# Patient Record
Sex: Female | Born: 2000 | Race: White | Hispanic: No | Marital: Single | Smoking: Never smoker
Health system: Southern US, Community
[De-identification: ages and names within clinical notes are randomized; demographics above are authoritative.]

---
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
## 2019-11-23 DIAGNOSIS — R200 Anesthesia of skin: Secondary | ICD-10-CM | POA: Diagnosis not present

## 2019-11-30 DIAGNOSIS — M542 Cervicalgia: Secondary | ICD-10-CM | POA: Diagnosis not present

## 2019-12-07 DIAGNOSIS — M542 Cervicalgia: Secondary | ICD-10-CM | POA: Diagnosis not present

## 2019-12-14 DIAGNOSIS — M542 Cervicalgia: Secondary | ICD-10-CM | POA: Diagnosis not present

## 2019-12-15 DIAGNOSIS — M47812 Spondylosis without myelopathy or radiculopathy, cervical region: Secondary | ICD-10-CM | POA: Diagnosis not present

## 2019-12-15 DIAGNOSIS — M542 Cervicalgia: Secondary | ICD-10-CM | POA: Diagnosis not present

## 2019-12-20 DIAGNOSIS — M542 Cervicalgia: Secondary | ICD-10-CM | POA: Diagnosis not present

## 2019-12-20 DIAGNOSIS — M47812 Spondylosis without myelopathy or radiculopathy, cervical region: Secondary | ICD-10-CM | POA: Diagnosis not present

## 2019-12-22 DIAGNOSIS — M542 Cervicalgia: Secondary | ICD-10-CM | POA: Diagnosis not present

## 2019-12-22 DIAGNOSIS — M47812 Spondylosis without myelopathy or radiculopathy, cervical region: Secondary | ICD-10-CM | POA: Diagnosis not present

## 2019-12-23 DIAGNOSIS — R200 Anesthesia of skin: Secondary | ICD-10-CM | POA: Diagnosis not present

## 2019-12-31 DIAGNOSIS — M542 Cervicalgia: Secondary | ICD-10-CM | POA: Diagnosis not present

## 2019-12-31 DIAGNOSIS — M47812 Spondylosis without myelopathy or radiculopathy, cervical region: Secondary | ICD-10-CM | POA: Diagnosis not present

## 2020-01-05 DIAGNOSIS — R200 Anesthesia of skin: Secondary | ICD-10-CM | POA: Diagnosis not present

## 2020-01-06 DIAGNOSIS — M542 Cervicalgia: Secondary | ICD-10-CM | POA: Diagnosis not present

## 2020-01-06 DIAGNOSIS — M47812 Spondylosis without myelopathy or radiculopathy, cervical region: Secondary | ICD-10-CM | POA: Diagnosis not present

## 2020-01-07 DIAGNOSIS — M542 Cervicalgia: Secondary | ICD-10-CM | POA: Diagnosis not present

## 2020-01-07 DIAGNOSIS — M549 Dorsalgia, unspecified: Secondary | ICD-10-CM | POA: Diagnosis not present

## 2020-01-10 DIAGNOSIS — M542 Cervicalgia: Secondary | ICD-10-CM | POA: Diagnosis not present

## 2020-01-10 DIAGNOSIS — M47812 Spondylosis without myelopathy or radiculopathy, cervical region: Secondary | ICD-10-CM | POA: Diagnosis not present

## 2020-01-11 DIAGNOSIS — S93402A Sprain of unspecified ligament of left ankle, initial encounter: Secondary | ICD-10-CM | POA: Diagnosis not present

## 2020-01-11 DIAGNOSIS — S93602A Unspecified sprain of left foot, initial encounter: Secondary | ICD-10-CM | POA: Diagnosis not present

## 2020-01-13 DIAGNOSIS — M549 Dorsalgia, unspecified: Secondary | ICD-10-CM | POA: Diagnosis not present

## 2020-01-13 DIAGNOSIS — Z0000 Encounter for general adult medical examination without abnormal findings: Secondary | ICD-10-CM | POA: Diagnosis not present

## 2020-01-13 DIAGNOSIS — M542 Cervicalgia: Secondary | ICD-10-CM | POA: Diagnosis not present

## 2020-01-13 DIAGNOSIS — Z13220 Encounter for screening for lipoid disorders: Secondary | ICD-10-CM | POA: Diagnosis not present

## 2020-01-13 DIAGNOSIS — Z7189 Other specified counseling: Secondary | ICD-10-CM | POA: Diagnosis not present

## 2020-01-17 DIAGNOSIS — M47812 Spondylosis without myelopathy or radiculopathy, cervical region: Secondary | ICD-10-CM | POA: Diagnosis not present

## 2020-01-17 DIAGNOSIS — M542 Cervicalgia: Secondary | ICD-10-CM | POA: Diagnosis not present

## 2020-01-18 DIAGNOSIS — R200 Anesthesia of skin: Secondary | ICD-10-CM | POA: Diagnosis not present

## 2020-01-18 DIAGNOSIS — M25531 Pain in right wrist: Secondary | ICD-10-CM | POA: Diagnosis not present

## 2020-01-25 DIAGNOSIS — S93402A Sprain of unspecified ligament of left ankle, initial encounter: Secondary | ICD-10-CM | POA: Diagnosis not present

## 2020-01-27 ENCOUNTER — Other Ambulatory Visit: Payer: Self-pay | Admitting: Family Medicine

## 2020-01-27 DIAGNOSIS — S93602S Unspecified sprain of left foot, sequela: Secondary | ICD-10-CM

## 2020-02-03 DIAGNOSIS — R200 Anesthesia of skin: Secondary | ICD-10-CM | POA: Diagnosis not present

## 2020-02-07 ENCOUNTER — Ambulatory Visit
Admission: RE | Admit: 2020-02-07 | Discharge: 2020-02-07 | Disposition: A | Discharge status: Home / Self Care | Payer: BC Managed Care – PPO | Source: Ambulatory Visit | Attending: Family Medicine | Admitting: Family Medicine

## 2020-02-07 DIAGNOSIS — R600 Localized edema: Secondary | ICD-10-CM | POA: Diagnosis not present

## 2020-02-07 DIAGNOSIS — S93602S Unspecified sprain of left foot, sequela: Secondary | ICD-10-CM

## 2020-02-07 DIAGNOSIS — M79672 Pain in left foot: Secondary | ICD-10-CM | POA: Diagnosis not present

## 2020-02-08 DIAGNOSIS — G5600 Carpal tunnel syndrome, unspecified upper limb: Secondary | ICD-10-CM | POA: Diagnosis not present

## 2020-02-08 DIAGNOSIS — M40209 Unspecified kyphosis, site unspecified: Secondary | ICD-10-CM | POA: Diagnosis not present

## 2020-02-16 DIAGNOSIS — S93602A Unspecified sprain of left foot, initial encounter: Secondary | ICD-10-CM | POA: Diagnosis not present

## 2020-03-13 DIAGNOSIS — R200 Anesthesia of skin: Secondary | ICD-10-CM | POA: Diagnosis not present

## 2020-03-13 DIAGNOSIS — R202 Paresthesia of skin: Secondary | ICD-10-CM | POA: Diagnosis not present

## 2020-03-13 DIAGNOSIS — R208 Other disturbances of skin sensation: Secondary | ICD-10-CM | POA: Diagnosis not present

## 2020-03-14 DIAGNOSIS — R339 Retention of urine, unspecified: Secondary | ICD-10-CM | POA: Diagnosis not present

## 2020-04-15 DIAGNOSIS — R200 Anesthesia of skin: Secondary | ICD-10-CM | POA: Diagnosis not present

## 2020-04-15 DIAGNOSIS — G35 Multiple sclerosis: Secondary | ICD-10-CM | POA: Diagnosis not present

## 2020-04-15 DIAGNOSIS — R202 Paresthesia of skin: Secondary | ICD-10-CM | POA: Diagnosis not present

## 2020-04-15 DIAGNOSIS — R208 Other disturbances of skin sensation: Secondary | ICD-10-CM | POA: Diagnosis not present

## 2020-05-02 DIAGNOSIS — Z01419 Encounter for gynecological examination (general) (routine) without abnormal findings: Secondary | ICD-10-CM | POA: Diagnosis not present

## 2020-05-02 DIAGNOSIS — N939 Abnormal uterine and vaginal bleeding, unspecified: Secondary | ICD-10-CM | POA: Diagnosis not present

## 2020-06-14 DIAGNOSIS — N939 Abnormal uterine and vaginal bleeding, unspecified: Secondary | ICD-10-CM | POA: Diagnosis not present

## 2020-06-14 DIAGNOSIS — R200 Anesthesia of skin: Secondary | ICD-10-CM | POA: Diagnosis not present

## 2020-06-14 DIAGNOSIS — R202 Paresthesia of skin: Secondary | ICD-10-CM | POA: Diagnosis not present

## 2020-06-14 DIAGNOSIS — R208 Other disturbances of skin sensation: Secondary | ICD-10-CM | POA: Diagnosis not present

## 2020-06-20 DIAGNOSIS — Z30013 Encounter for initial prescription of injectable contraceptive: Secondary | ICD-10-CM | POA: Diagnosis not present

## 2020-06-21 DIAGNOSIS — R350 Frequency of micturition: Secondary | ICD-10-CM | POA: Diagnosis not present

## 2020-06-21 DIAGNOSIS — N390 Urinary tract infection, site not specified: Secondary | ICD-10-CM | POA: Diagnosis not present

## 2020-07-04 DIAGNOSIS — Z3042 Encounter for surveillance of injectable contraceptive: Secondary | ICD-10-CM | POA: Diagnosis not present

## 2020-09-18 DIAGNOSIS — Z3042 Encounter for surveillance of injectable contraceptive: Secondary | ICD-10-CM | POA: Diagnosis not present

## 2020-11-13 DIAGNOSIS — R208 Other disturbances of skin sensation: Secondary | ICD-10-CM | POA: Diagnosis not present

## 2020-11-13 DIAGNOSIS — R202 Paresthesia of skin: Secondary | ICD-10-CM | POA: Diagnosis not present

## 2020-11-13 DIAGNOSIS — R200 Anesthesia of skin: Secondary | ICD-10-CM | POA: Diagnosis not present

## 2020-11-15 IMAGING — CT CT FOOT*L* W/O CM
3 of 5 series · 12 of 34 positions shown, 15 images · non-contrast
Comparison: None.

CLINICAL DATA: Fall from curved 1 month ago, dorsal foot pain. The
patient has been wearing a boot.

EXAM:
CT OF THE LEFT FOOT WITHOUT CONTRAST
TECHNIQUE: Multidetector CT imaging of the left foot was performed according to
the standard protocol. Multiplanar CT image reconstructions were
also generated.

[Series 5: sfov lower extremity 2.00 br40 s3 soft · axial · 0.51mm/px · z∈[+461,+559]mm · 5 of 76 slices shown, 7 images (1 of 2)]
[im 13/76  soft-tissue]
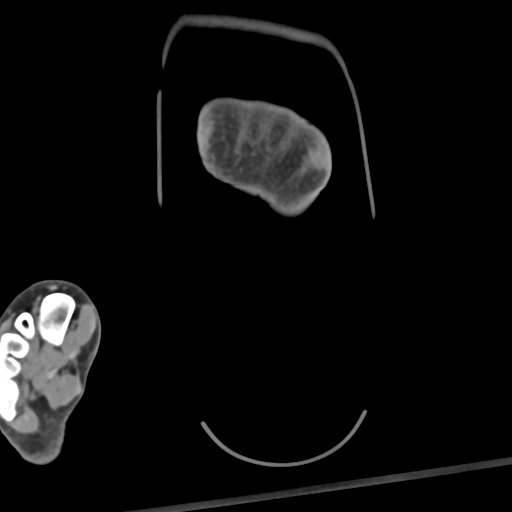
[im 13/76  bone]
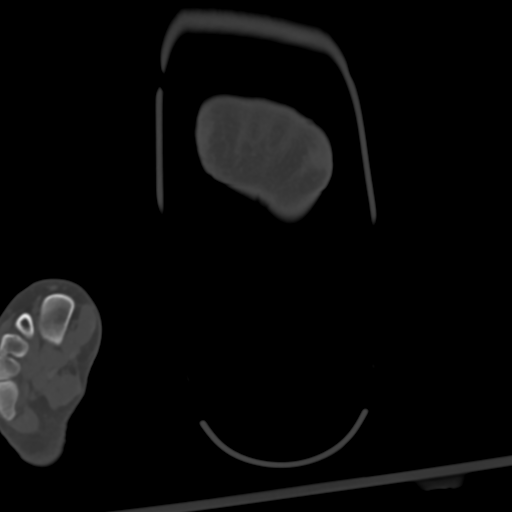
[im 26/76  bone]
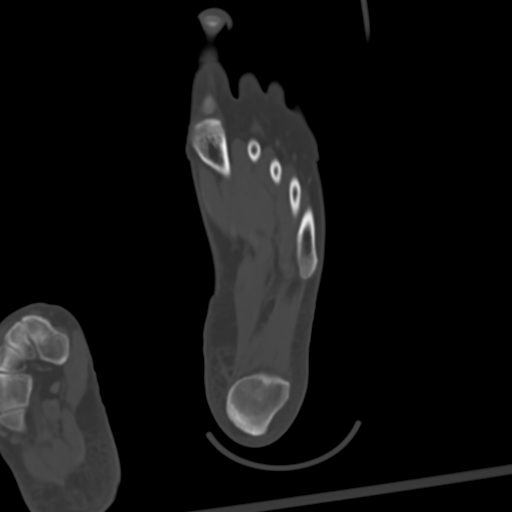
[im 38/76  bone]
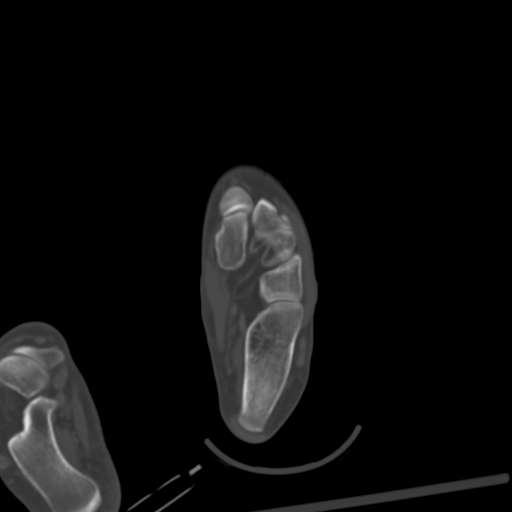
[im 51/76  bone]
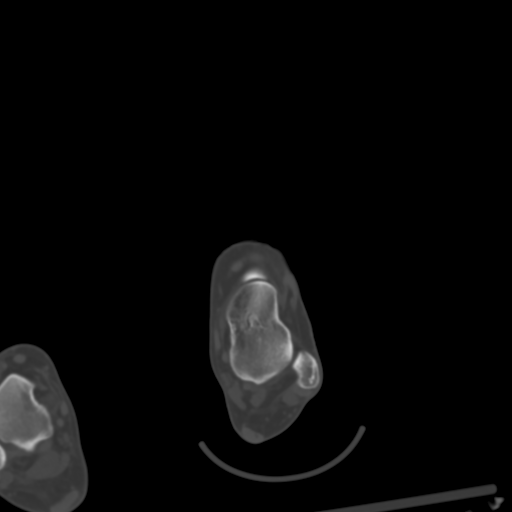
[im 63/76  soft-tissue]
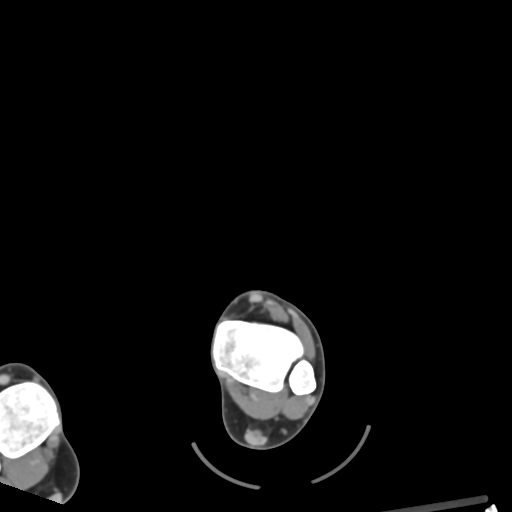
[im 63/76  bone]
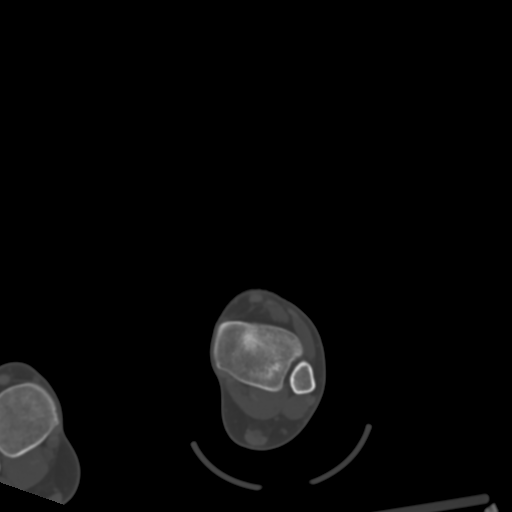

[Series 13: sfov lower extremity 2.00 br40 s3 soft · sagittal · 0.30mm/px · 5 of 42 slices shown, 6 images (2 of 2)]
[im 14/42  bone]
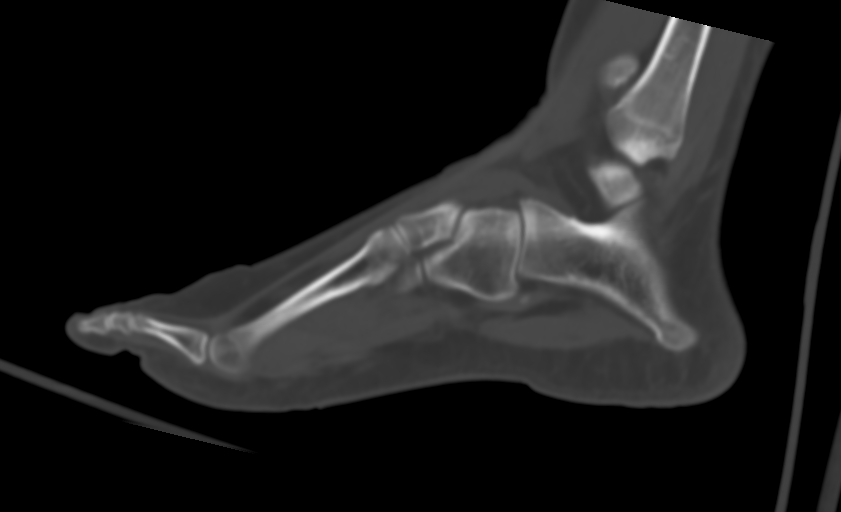
[im 18/42  bone]
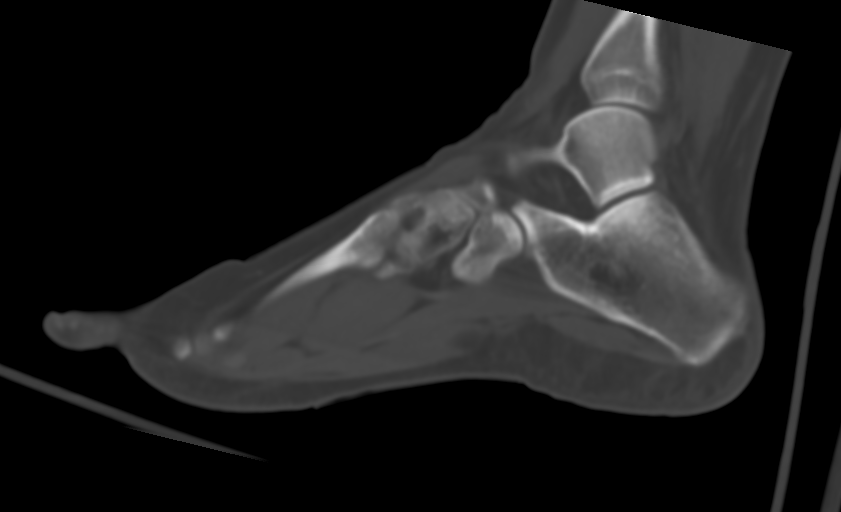
[im 21/42  soft-tissue]
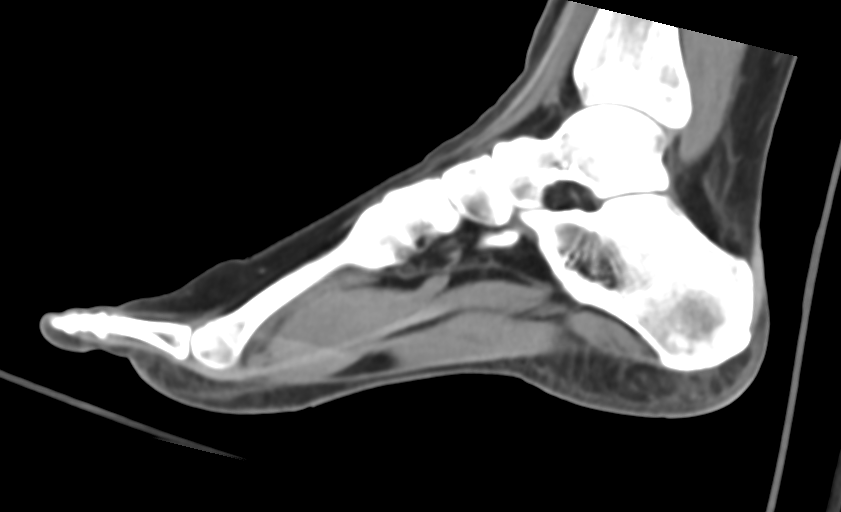
[im 21/42  bone]
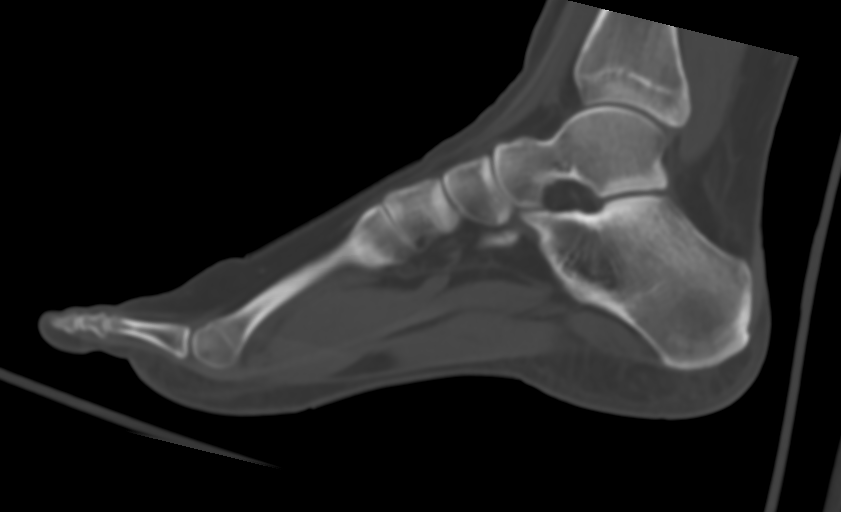
[im 24/42  bone]
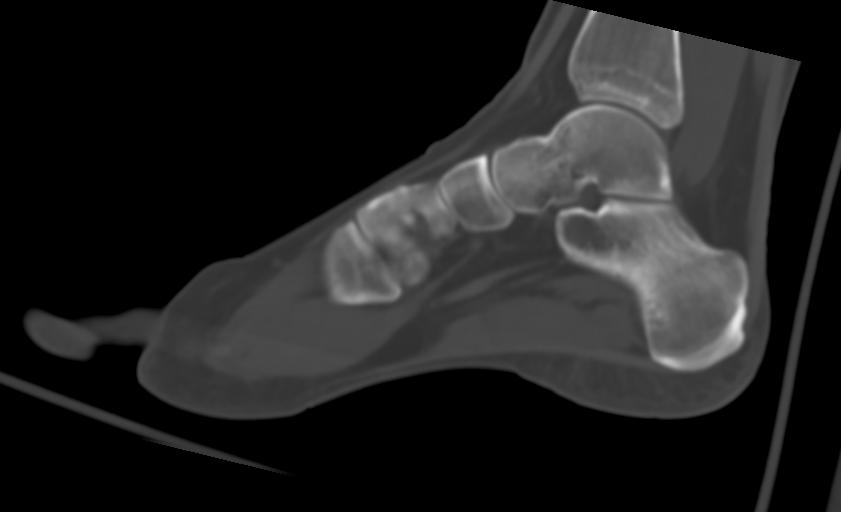
[im 28/42  bone]
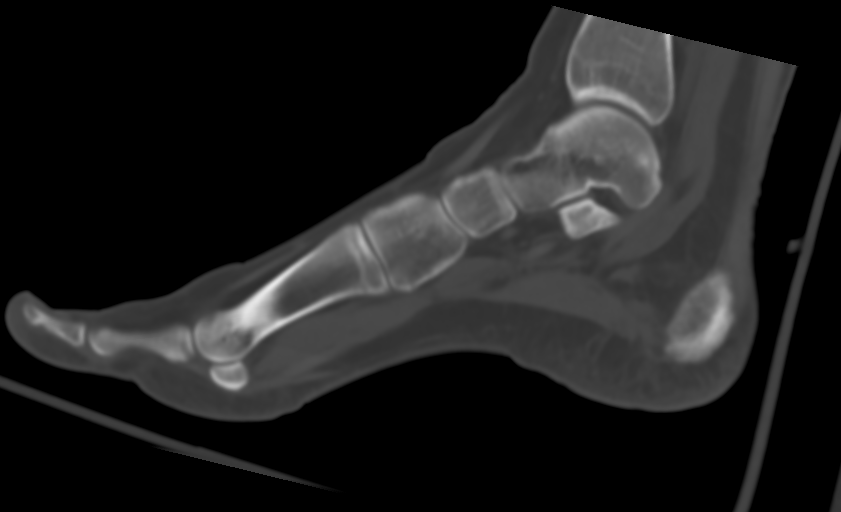

[Series 1001: mpr 2mm range · coronal · 0.51mm/px · 2 of 124 slices shown]
[im 52/124  bone]
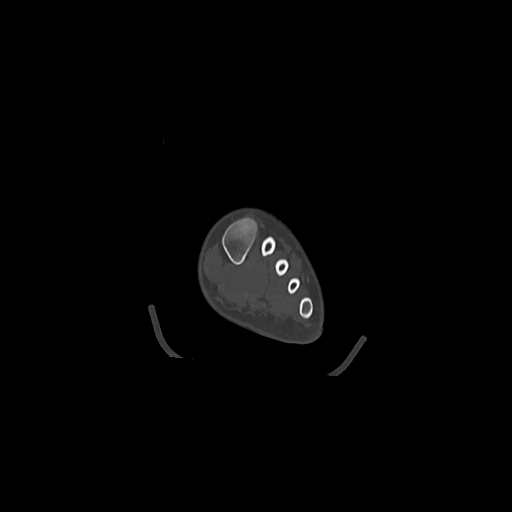
[im 104/124  bone]
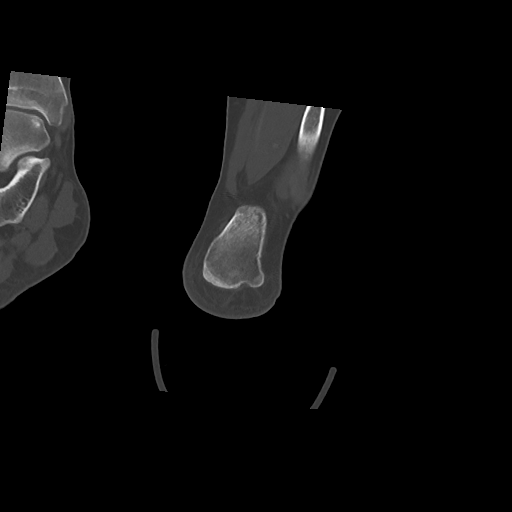

[12 of 34 positions shown; findings below may reference images not displayed]

FINDINGS: Bones/Joint/Cartilage

No fracture or significant malalignment is identified.

Ligaments

Suboptimally assessed by CT.

Muscles and Tendons

Os peroneus noted.

There is potentially thickening of the extensor digitorum longus
tendon for example on image [DATE] and image 28/13, with trace edema
deep to the extensor digitorum longus and just proximal to the
extensor digitorum brevis muscle for example on image [DATE] and image
26/13. Subtle cortical irregularity along the proximal extensor
digitorum brevis attachment for example on image 33/3 and image
[DATE] but without a well-defined avulsion currently.

Soft tissues

Unremarkable
IMPRESSION: 1. Subtle cortical irregularity but no appreciable avulsion along
the proximal extensor digitorum brevis origination site on the
anterior calcaneus, with trace edema deep to the extensor digitorum
longus and just proximal to the extensor digitorum brevis muscle.
This could represent tendinopathy or a low-grade partial tear of the
extensor digitorum longus tendon.
2. Os peroneus noted.

## 2020-11-20 DIAGNOSIS — M9901 Segmental and somatic dysfunction of cervical region: Secondary | ICD-10-CM | POA: Diagnosis not present

## 2020-11-20 DIAGNOSIS — M9903 Segmental and somatic dysfunction of lumbar region: Secondary | ICD-10-CM | POA: Diagnosis not present

## 2020-11-20 DIAGNOSIS — M546 Pain in thoracic spine: Secondary | ICD-10-CM | POA: Diagnosis not present

## 2020-11-20 DIAGNOSIS — M9902 Segmental and somatic dysfunction of thoracic region: Secondary | ICD-10-CM | POA: Diagnosis not present

## 2020-11-22 DIAGNOSIS — M546 Pain in thoracic spine: Secondary | ICD-10-CM | POA: Diagnosis not present

## 2020-11-22 DIAGNOSIS — M9902 Segmental and somatic dysfunction of thoracic region: Secondary | ICD-10-CM | POA: Diagnosis not present

## 2020-11-22 DIAGNOSIS — M9903 Segmental and somatic dysfunction of lumbar region: Secondary | ICD-10-CM | POA: Diagnosis not present

## 2020-11-22 DIAGNOSIS — M9901 Segmental and somatic dysfunction of cervical region: Secondary | ICD-10-CM | POA: Diagnosis not present

## 2020-11-27 DIAGNOSIS — M9901 Segmental and somatic dysfunction of cervical region: Secondary | ICD-10-CM | POA: Diagnosis not present

## 2020-11-27 DIAGNOSIS — M9902 Segmental and somatic dysfunction of thoracic region: Secondary | ICD-10-CM | POA: Diagnosis not present

## 2020-11-27 DIAGNOSIS — M546 Pain in thoracic spine: Secondary | ICD-10-CM | POA: Diagnosis not present

## 2020-11-27 DIAGNOSIS — M9903 Segmental and somatic dysfunction of lumbar region: Secondary | ICD-10-CM | POA: Diagnosis not present

## 2020-11-29 DIAGNOSIS — M546 Pain in thoracic spine: Secondary | ICD-10-CM | POA: Diagnosis not present

## 2020-11-29 DIAGNOSIS — M9902 Segmental and somatic dysfunction of thoracic region: Secondary | ICD-10-CM | POA: Diagnosis not present

## 2020-11-29 DIAGNOSIS — M9901 Segmental and somatic dysfunction of cervical region: Secondary | ICD-10-CM | POA: Diagnosis not present

## 2020-11-29 DIAGNOSIS — M9903 Segmental and somatic dysfunction of lumbar region: Secondary | ICD-10-CM | POA: Diagnosis not present

## 2020-12-04 DIAGNOSIS — M9902 Segmental and somatic dysfunction of thoracic region: Secondary | ICD-10-CM | POA: Diagnosis not present

## 2020-12-04 DIAGNOSIS — M9901 Segmental and somatic dysfunction of cervical region: Secondary | ICD-10-CM | POA: Diagnosis not present

## 2020-12-04 DIAGNOSIS — M9903 Segmental and somatic dysfunction of lumbar region: Secondary | ICD-10-CM | POA: Diagnosis not present

## 2020-12-04 DIAGNOSIS — M546 Pain in thoracic spine: Secondary | ICD-10-CM | POA: Diagnosis not present

## 2020-12-05 DIAGNOSIS — Z3042 Encounter for surveillance of injectable contraceptive: Secondary | ICD-10-CM | POA: Diagnosis not present

## 2020-12-08 DIAGNOSIS — M546 Pain in thoracic spine: Secondary | ICD-10-CM | POA: Diagnosis not present

## 2020-12-08 DIAGNOSIS — M9903 Segmental and somatic dysfunction of lumbar region: Secondary | ICD-10-CM | POA: Diagnosis not present

## 2020-12-08 DIAGNOSIS — M9901 Segmental and somatic dysfunction of cervical region: Secondary | ICD-10-CM | POA: Diagnosis not present

## 2020-12-08 DIAGNOSIS — M9902 Segmental and somatic dysfunction of thoracic region: Secondary | ICD-10-CM | POA: Diagnosis not present

## 2020-12-13 DIAGNOSIS — M546 Pain in thoracic spine: Secondary | ICD-10-CM | POA: Diagnosis not present

## 2020-12-13 DIAGNOSIS — M9902 Segmental and somatic dysfunction of thoracic region: Secondary | ICD-10-CM | POA: Diagnosis not present

## 2020-12-13 DIAGNOSIS — M9901 Segmental and somatic dysfunction of cervical region: Secondary | ICD-10-CM | POA: Diagnosis not present

## 2020-12-13 DIAGNOSIS — M9903 Segmental and somatic dysfunction of lumbar region: Secondary | ICD-10-CM | POA: Diagnosis not present

## 2020-12-20 DIAGNOSIS — M9901 Segmental and somatic dysfunction of cervical region: Secondary | ICD-10-CM | POA: Diagnosis not present

## 2020-12-20 DIAGNOSIS — M9902 Segmental and somatic dysfunction of thoracic region: Secondary | ICD-10-CM | POA: Diagnosis not present

## 2020-12-20 DIAGNOSIS — M546 Pain in thoracic spine: Secondary | ICD-10-CM | POA: Diagnosis not present

## 2020-12-20 DIAGNOSIS — M9903 Segmental and somatic dysfunction of lumbar region: Secondary | ICD-10-CM | POA: Diagnosis not present

## 2021-01-03 DIAGNOSIS — M9902 Segmental and somatic dysfunction of thoracic region: Secondary | ICD-10-CM | POA: Diagnosis not present

## 2021-01-03 DIAGNOSIS — M9901 Segmental and somatic dysfunction of cervical region: Secondary | ICD-10-CM | POA: Diagnosis not present

## 2021-01-03 DIAGNOSIS — M9903 Segmental and somatic dysfunction of lumbar region: Secondary | ICD-10-CM | POA: Diagnosis not present

## 2021-01-03 DIAGNOSIS — M546 Pain in thoracic spine: Secondary | ICD-10-CM | POA: Diagnosis not present

## 2021-01-24 DIAGNOSIS — M9903 Segmental and somatic dysfunction of lumbar region: Secondary | ICD-10-CM | POA: Diagnosis not present

## 2021-01-24 DIAGNOSIS — M9902 Segmental and somatic dysfunction of thoracic region: Secondary | ICD-10-CM | POA: Diagnosis not present

## 2021-01-24 DIAGNOSIS — M9901 Segmental and somatic dysfunction of cervical region: Secondary | ICD-10-CM | POA: Diagnosis not present

## 2021-01-24 DIAGNOSIS — M546 Pain in thoracic spine: Secondary | ICD-10-CM | POA: Diagnosis not present

## 2021-02-19 DIAGNOSIS — R208 Other disturbances of skin sensation: Secondary | ICD-10-CM | POA: Diagnosis not present

## 2021-02-19 DIAGNOSIS — R202 Paresthesia of skin: Secondary | ICD-10-CM | POA: Diagnosis not present

## 2021-02-19 DIAGNOSIS — R200 Anesthesia of skin: Secondary | ICD-10-CM | POA: Diagnosis not present

## 2021-02-28 DIAGNOSIS — M9901 Segmental and somatic dysfunction of cervical region: Secondary | ICD-10-CM | POA: Diagnosis not present

## 2021-02-28 DIAGNOSIS — M546 Pain in thoracic spine: Secondary | ICD-10-CM | POA: Diagnosis not present

## 2021-02-28 DIAGNOSIS — M9903 Segmental and somatic dysfunction of lumbar region: Secondary | ICD-10-CM | POA: Diagnosis not present

## 2021-02-28 DIAGNOSIS — M9902 Segmental and somatic dysfunction of thoracic region: Secondary | ICD-10-CM | POA: Diagnosis not present

## 2021-03-05 DIAGNOSIS — M9903 Segmental and somatic dysfunction of lumbar region: Secondary | ICD-10-CM | POA: Diagnosis not present

## 2021-03-05 DIAGNOSIS — M9901 Segmental and somatic dysfunction of cervical region: Secondary | ICD-10-CM | POA: Diagnosis not present

## 2021-03-05 DIAGNOSIS — M546 Pain in thoracic spine: Secondary | ICD-10-CM | POA: Diagnosis not present

## 2021-03-05 DIAGNOSIS — M9902 Segmental and somatic dysfunction of thoracic region: Secondary | ICD-10-CM | POA: Diagnosis not present

## 2021-03-07 DIAGNOSIS — Z3042 Encounter for surveillance of injectable contraceptive: Secondary | ICD-10-CM | POA: Diagnosis not present

## 2021-03-08 DIAGNOSIS — M9902 Segmental and somatic dysfunction of thoracic region: Secondary | ICD-10-CM | POA: Diagnosis not present

## 2021-03-08 DIAGNOSIS — M9903 Segmental and somatic dysfunction of lumbar region: Secondary | ICD-10-CM | POA: Diagnosis not present

## 2021-03-08 DIAGNOSIS — M9901 Segmental and somatic dysfunction of cervical region: Secondary | ICD-10-CM | POA: Diagnosis not present

## 2021-03-08 DIAGNOSIS — M546 Pain in thoracic spine: Secondary | ICD-10-CM | POA: Diagnosis not present

## 2021-03-22 DIAGNOSIS — R202 Paresthesia of skin: Secondary | ICD-10-CM | POA: Diagnosis not present

## 2021-03-22 DIAGNOSIS — M50221 Other cervical disc displacement at C4-C5 level: Secondary | ICD-10-CM | POA: Diagnosis not present

## 2021-03-22 DIAGNOSIS — M2578 Osteophyte, vertebrae: Secondary | ICD-10-CM | POA: Diagnosis not present

## 2021-03-22 DIAGNOSIS — R208 Other disturbances of skin sensation: Secondary | ICD-10-CM | POA: Diagnosis not present

## 2021-03-22 DIAGNOSIS — R200 Anesthesia of skin: Secondary | ICD-10-CM | POA: Diagnosis not present

## 2021-05-16 DIAGNOSIS — R208 Other disturbances of skin sensation: Secondary | ICD-10-CM | POA: Diagnosis not present

## 2021-05-16 DIAGNOSIS — R200 Anesthesia of skin: Secondary | ICD-10-CM | POA: Diagnosis not present

## 2021-05-16 DIAGNOSIS — R202 Paresthesia of skin: Secondary | ICD-10-CM | POA: Diagnosis not present

## 2021-05-28 DIAGNOSIS — Z3042 Encounter for surveillance of injectable contraceptive: Secondary | ICD-10-CM | POA: Diagnosis not present

## 2021-07-18 DIAGNOSIS — Z01419 Encounter for gynecological examination (general) (routine) without abnormal findings: Secondary | ICD-10-CM | POA: Diagnosis not present

## 2021-07-18 DIAGNOSIS — Z124 Encounter for screening for malignant neoplasm of cervix: Secondary | ICD-10-CM | POA: Diagnosis not present

## 2021-07-25 DIAGNOSIS — M6289 Other specified disorders of muscle: Secondary | ICD-10-CM | POA: Diagnosis not present

## 2021-07-25 DIAGNOSIS — K5902 Outlet dysfunction constipation: Secondary | ICD-10-CM | POA: Diagnosis not present

## 2021-07-25 DIAGNOSIS — R102 Pelvic and perineal pain: Secondary | ICD-10-CM | POA: Diagnosis not present

## 2021-07-25 DIAGNOSIS — R350 Frequency of micturition: Secondary | ICD-10-CM | POA: Diagnosis not present

## 2021-07-31 DIAGNOSIS — H6123 Impacted cerumen, bilateral: Secondary | ICD-10-CM | POA: Diagnosis not present

## 2021-08-01 DIAGNOSIS — R102 Pelvic and perineal pain: Secondary | ICD-10-CM | POA: Diagnosis not present

## 2021-08-01 DIAGNOSIS — M6289 Other specified disorders of muscle: Secondary | ICD-10-CM | POA: Diagnosis not present

## 2021-08-01 DIAGNOSIS — R350 Frequency of micturition: Secondary | ICD-10-CM | POA: Diagnosis not present

## 2021-08-01 DIAGNOSIS — K5902 Outlet dysfunction constipation: Secondary | ICD-10-CM | POA: Diagnosis not present

## 2021-08-06 DIAGNOSIS — R350 Frequency of micturition: Secondary | ICD-10-CM | POA: Diagnosis not present

## 2021-08-06 DIAGNOSIS — M6289 Other specified disorders of muscle: Secondary | ICD-10-CM | POA: Diagnosis not present

## 2021-08-06 DIAGNOSIS — R102 Pelvic and perineal pain: Secondary | ICD-10-CM | POA: Diagnosis not present

## 2021-08-06 DIAGNOSIS — K5902 Outlet dysfunction constipation: Secondary | ICD-10-CM | POA: Diagnosis not present

## 2021-08-13 DIAGNOSIS — Z3042 Encounter for surveillance of injectable contraceptive: Secondary | ICD-10-CM | POA: Diagnosis not present

## 2021-08-16 DIAGNOSIS — K5902 Outlet dysfunction constipation: Secondary | ICD-10-CM | POA: Diagnosis not present

## 2021-08-16 DIAGNOSIS — R350 Frequency of micturition: Secondary | ICD-10-CM | POA: Diagnosis not present

## 2021-08-16 DIAGNOSIS — M6289 Other specified disorders of muscle: Secondary | ICD-10-CM | POA: Diagnosis not present

## 2021-08-16 DIAGNOSIS — R102 Pelvic and perineal pain: Secondary | ICD-10-CM | POA: Diagnosis not present

## 2021-08-23 DIAGNOSIS — R102 Pelvic and perineal pain: Secondary | ICD-10-CM | POA: Diagnosis not present

## 2021-08-23 DIAGNOSIS — R350 Frequency of micturition: Secondary | ICD-10-CM | POA: Diagnosis not present

## 2021-08-23 DIAGNOSIS — M6289 Other specified disorders of muscle: Secondary | ICD-10-CM | POA: Diagnosis not present

## 2021-08-23 DIAGNOSIS — K5902 Outlet dysfunction constipation: Secondary | ICD-10-CM | POA: Diagnosis not present

## 2021-08-29 DIAGNOSIS — R102 Pelvic and perineal pain: Secondary | ICD-10-CM | POA: Diagnosis not present

## 2021-08-29 DIAGNOSIS — R350 Frequency of micturition: Secondary | ICD-10-CM | POA: Diagnosis not present

## 2021-08-29 DIAGNOSIS — M6289 Other specified disorders of muscle: Secondary | ICD-10-CM | POA: Diagnosis not present

## 2021-08-29 DIAGNOSIS — K5902 Outlet dysfunction constipation: Secondary | ICD-10-CM | POA: Diagnosis not present

## 2021-08-30 DIAGNOSIS — Z0000 Encounter for general adult medical examination without abnormal findings: Secondary | ICD-10-CM | POA: Diagnosis not present

## 2021-09-05 DIAGNOSIS — R350 Frequency of micturition: Secondary | ICD-10-CM | POA: Diagnosis not present

## 2021-09-05 DIAGNOSIS — K5902 Outlet dysfunction constipation: Secondary | ICD-10-CM | POA: Diagnosis not present

## 2021-09-05 DIAGNOSIS — M6289 Other specified disorders of muscle: Secondary | ICD-10-CM | POA: Diagnosis not present

## 2021-09-05 DIAGNOSIS — R102 Pelvic and perineal pain: Secondary | ICD-10-CM | POA: Diagnosis not present

## 2021-09-13 DIAGNOSIS — R350 Frequency of micturition: Secondary | ICD-10-CM | POA: Diagnosis not present

## 2021-09-13 DIAGNOSIS — K5902 Outlet dysfunction constipation: Secondary | ICD-10-CM | POA: Diagnosis not present

## 2021-09-13 DIAGNOSIS — R102 Pelvic and perineal pain: Secondary | ICD-10-CM | POA: Diagnosis not present

## 2021-09-13 DIAGNOSIS — M6289 Other specified disorders of muscle: Secondary | ICD-10-CM | POA: Diagnosis not present

## 2021-09-18 DIAGNOSIS — K5902 Outlet dysfunction constipation: Secondary | ICD-10-CM | POA: Diagnosis not present

## 2021-09-18 DIAGNOSIS — M6289 Other specified disorders of muscle: Secondary | ICD-10-CM | POA: Diagnosis not present

## 2021-09-18 DIAGNOSIS — R350 Frequency of micturition: Secondary | ICD-10-CM | POA: Diagnosis not present

## 2021-09-18 DIAGNOSIS — R102 Pelvic and perineal pain: Secondary | ICD-10-CM | POA: Diagnosis not present

## 2021-09-19 DIAGNOSIS — R208 Other disturbances of skin sensation: Secondary | ICD-10-CM | POA: Diagnosis not present

## 2021-09-19 DIAGNOSIS — R200 Anesthesia of skin: Secondary | ICD-10-CM | POA: Diagnosis not present

## 2021-09-19 DIAGNOSIS — R202 Paresthesia of skin: Secondary | ICD-10-CM | POA: Diagnosis not present

## 2021-09-25 DIAGNOSIS — K5902 Outlet dysfunction constipation: Secondary | ICD-10-CM | POA: Diagnosis not present

## 2021-09-25 DIAGNOSIS — R102 Pelvic and perineal pain: Secondary | ICD-10-CM | POA: Diagnosis not present

## 2021-09-25 DIAGNOSIS — M6289 Other specified disorders of muscle: Secondary | ICD-10-CM | POA: Diagnosis not present

## 2021-09-25 DIAGNOSIS — R350 Frequency of micturition: Secondary | ICD-10-CM | POA: Diagnosis not present

## 2021-10-01 DIAGNOSIS — Z136 Encounter for screening for cardiovascular disorders: Secondary | ICD-10-CM | POA: Diagnosis not present

## 2021-10-01 DIAGNOSIS — Z1151 Encounter for screening for human papillomavirus (HPV): Secondary | ICD-10-CM | POA: Diagnosis not present

## 2021-10-01 DIAGNOSIS — Z01419 Encounter for gynecological examination (general) (routine) without abnormal findings: Secondary | ICD-10-CM | POA: Diagnosis not present

## 2021-10-01 DIAGNOSIS — R102 Pelvic and perineal pain: Secondary | ICD-10-CM | POA: Diagnosis not present

## 2021-10-01 DIAGNOSIS — Z113 Encounter for screening for infections with a predominantly sexual mode of transmission: Secondary | ICD-10-CM | POA: Diagnosis not present

## 2021-10-01 DIAGNOSIS — Z0000 Encounter for general adult medical examination without abnormal findings: Secondary | ICD-10-CM | POA: Diagnosis not present

## 2021-10-01 DIAGNOSIS — Z131 Encounter for screening for diabetes mellitus: Secondary | ICD-10-CM | POA: Diagnosis not present

## 2021-10-01 DIAGNOSIS — Z13228 Encounter for screening for other metabolic disorders: Secondary | ICD-10-CM | POA: Diagnosis not present

## 2021-10-02 DIAGNOSIS — K5902 Outlet dysfunction constipation: Secondary | ICD-10-CM | POA: Diagnosis not present

## 2021-10-02 DIAGNOSIS — R350 Frequency of micturition: Secondary | ICD-10-CM | POA: Diagnosis not present

## 2021-10-02 DIAGNOSIS — M6289 Other specified disorders of muscle: Secondary | ICD-10-CM | POA: Diagnosis not present

## 2021-10-02 DIAGNOSIS — R102 Pelvic and perineal pain: Secondary | ICD-10-CM | POA: Diagnosis not present

## 2021-10-08 DIAGNOSIS — R799 Abnormal finding of blood chemistry, unspecified: Secondary | ICD-10-CM | POA: Diagnosis not present

## 2021-10-09 DIAGNOSIS — R350 Frequency of micturition: Secondary | ICD-10-CM | POA: Diagnosis not present

## 2021-10-09 DIAGNOSIS — M6289 Other specified disorders of muscle: Secondary | ICD-10-CM | POA: Diagnosis not present

## 2021-10-09 DIAGNOSIS — K5902 Outlet dysfunction constipation: Secondary | ICD-10-CM | POA: Diagnosis not present

## 2021-10-09 DIAGNOSIS — R102 Pelvic and perineal pain: Secondary | ICD-10-CM | POA: Diagnosis not present

## 2021-10-16 DIAGNOSIS — R102 Pelvic and perineal pain: Secondary | ICD-10-CM | POA: Diagnosis not present

## 2021-10-16 DIAGNOSIS — K5902 Outlet dysfunction constipation: Secondary | ICD-10-CM | POA: Diagnosis not present

## 2021-10-16 DIAGNOSIS — M6289 Other specified disorders of muscle: Secondary | ICD-10-CM | POA: Diagnosis not present

## 2021-10-16 DIAGNOSIS — R350 Frequency of micturition: Secondary | ICD-10-CM | POA: Diagnosis not present

## 2021-10-23 DIAGNOSIS — M6289 Other specified disorders of muscle: Secondary | ICD-10-CM | POA: Diagnosis not present

## 2021-10-23 DIAGNOSIS — K5902 Outlet dysfunction constipation: Secondary | ICD-10-CM | POA: Diagnosis not present

## 2021-10-23 DIAGNOSIS — R350 Frequency of micturition: Secondary | ICD-10-CM | POA: Diagnosis not present

## 2021-10-23 DIAGNOSIS — R102 Pelvic and perineal pain: Secondary | ICD-10-CM | POA: Diagnosis not present

## 2021-10-29 DIAGNOSIS — Z3042 Encounter for surveillance of injectable contraceptive: Secondary | ICD-10-CM | POA: Diagnosis not present

## 2021-11-01 DIAGNOSIS — R350 Frequency of micturition: Secondary | ICD-10-CM | POA: Diagnosis not present

## 2021-11-01 DIAGNOSIS — R102 Pelvic and perineal pain: Secondary | ICD-10-CM | POA: Diagnosis not present

## 2021-11-01 DIAGNOSIS — M6289 Other specified disorders of muscle: Secondary | ICD-10-CM | POA: Diagnosis not present

## 2021-11-01 DIAGNOSIS — K5902 Outlet dysfunction constipation: Secondary | ICD-10-CM | POA: Diagnosis not present

## 2021-11-13 DIAGNOSIS — R350 Frequency of micturition: Secondary | ICD-10-CM | POA: Diagnosis not present

## 2021-11-13 DIAGNOSIS — K5902 Outlet dysfunction constipation: Secondary | ICD-10-CM | POA: Diagnosis not present

## 2021-11-13 DIAGNOSIS — M6289 Other specified disorders of muscle: Secondary | ICD-10-CM | POA: Diagnosis not present

## 2021-11-13 DIAGNOSIS — R102 Pelvic and perineal pain: Secondary | ICD-10-CM | POA: Diagnosis not present

## 2021-11-15 DIAGNOSIS — H9201 Otalgia, right ear: Secondary | ICD-10-CM | POA: Diagnosis not present

## 2021-11-19 DIAGNOSIS — R350 Frequency of micturition: Secondary | ICD-10-CM | POA: Diagnosis not present

## 2021-11-19 DIAGNOSIS — M6289 Other specified disorders of muscle: Secondary | ICD-10-CM | POA: Diagnosis not present

## 2021-11-19 DIAGNOSIS — K5902 Outlet dysfunction constipation: Secondary | ICD-10-CM | POA: Diagnosis not present

## 2021-11-19 DIAGNOSIS — R102 Pelvic and perineal pain: Secondary | ICD-10-CM | POA: Diagnosis not present

## 2021-11-26 DIAGNOSIS — H6121 Impacted cerumen, right ear: Secondary | ICD-10-CM | POA: Diagnosis not present

## 2021-11-26 DIAGNOSIS — H9201 Otalgia, right ear: Secondary | ICD-10-CM | POA: Diagnosis not present

## 2021-11-29 DIAGNOSIS — K5902 Outlet dysfunction constipation: Secondary | ICD-10-CM | POA: Diagnosis not present

## 2021-11-29 DIAGNOSIS — M6289 Other specified disorders of muscle: Secondary | ICD-10-CM | POA: Diagnosis not present

## 2021-11-29 DIAGNOSIS — R350 Frequency of micturition: Secondary | ICD-10-CM | POA: Diagnosis not present

## 2021-11-29 DIAGNOSIS — R102 Pelvic and perineal pain: Secondary | ICD-10-CM | POA: Diagnosis not present

## 2022-04-03 DIAGNOSIS — R102 Pelvic and perineal pain: Secondary | ICD-10-CM | POA: Diagnosis not present

## 2022-04-03 DIAGNOSIS — M25561 Pain in right knee: Secondary | ICD-10-CM | POA: Diagnosis not present
